# Patient Record
Sex: Male | Born: 1970 | Race: White | Hispanic: No | Marital: Single | Smoking: Never smoker
Health system: Southern US, Community
[De-identification: ages and names within clinical notes are randomized; demographics above are authoritative.]

## PROBLEM LIST (undated history)

## (undated) DIAGNOSIS — Z9889 Other specified postprocedural states: Secondary | ICD-10-CM

## (undated) DIAGNOSIS — R200 Anesthesia of skin: Secondary | ICD-10-CM

## (undated) DIAGNOSIS — M549 Dorsalgia, unspecified: Secondary | ICD-10-CM

## (undated) DIAGNOSIS — R112 Nausea with vomiting, unspecified: Secondary | ICD-10-CM

## (undated) DIAGNOSIS — J302 Other seasonal allergic rhinitis: Secondary | ICD-10-CM

## (undated) DIAGNOSIS — M1990 Unspecified osteoarthritis, unspecified site: Secondary | ICD-10-CM

## (undated) DIAGNOSIS — Z87442 Personal history of urinary calculi: Secondary | ICD-10-CM

## (undated) DIAGNOSIS — M62830 Muscle spasm of back: Secondary | ICD-10-CM

## (undated) DIAGNOSIS — G8929 Other chronic pain: Secondary | ICD-10-CM

## (undated) HISTORY — PX: FOOT SURGERY: SHX648

## (undated) HISTORY — PX: TONSILLECTOMY: SUR1361

---

## 2010-08-28 HISTORY — PX: OTHER SURGICAL HISTORY: SHX169

## 2011-08-29 HISTORY — PX: ANTERIOR CRUCIATE LIGAMENT REPAIR: SHX115

## 2013-08-08 HISTORY — PX: BACK SURGERY: SHX140

## 2014-01-01 ENCOUNTER — Other Ambulatory Visit: Payer: Self-pay | Admitting: Neurosurgery

## 2014-01-01 DIAGNOSIS — M5126 Other intervertebral disc displacement, lumbar region: Secondary | ICD-10-CM

## 2014-01-07 ENCOUNTER — Ambulatory Visit
Admission: RE | Admit: 2014-01-07 | Discharge: 2014-01-07 | Disposition: A | Discharge status: Home / Self Care | Payer: 59 | Source: Ambulatory Visit | Attending: Neurosurgery | Admitting: Neurosurgery

## 2014-01-07 VITALS — BP 104/67 | HR 61

## 2014-01-07 DIAGNOSIS — M5126 Other intervertebral disc displacement, lumbar region: Secondary | ICD-10-CM

## 2014-01-07 MED ORDER — MEPERIDINE HCL 100 MG/ML IJ SOLN
75.0000 mg | Freq: Once | INTRAMUSCULAR | Status: AC
  Administered 2014-01-07: 75 mg via INTRAMUSCULAR

## 2014-01-07 MED ORDER — ONDANSETRON HCL 4 MG/2ML IJ SOLN
4.0000 mg | Freq: Once | INTRAMUSCULAR | Status: AC
  Administered 2014-01-07: 4 mg via INTRAMUSCULAR

## 2014-01-07 MED ORDER — DIAZEPAM 5 MG PO TABS
10.0000 mg | ORAL_TABLET | Freq: Once | ORAL | Status: AC
  Administered 2014-01-07: 10 mg via ORAL

## 2014-01-07 MED ORDER — IOHEXOL 180 MG/ML  SOLN
15.0000 mL | Freq: Once | INTRAMUSCULAR | Status: AC | PRN
  Administered 2014-01-07: 15 mL via INTRATHECAL

## 2014-01-07 NOTE — Progress Notes (Signed)
Dr. Carlota RaspberryLamke in to visit and explained procedure to pt.

## 2014-01-07 NOTE — Discharge Instructions (Signed)

## 2014-01-07 NOTE — Progress Notes (Signed)
Late entry, BP from pre-procedure recorded at 10 03 am.

## 2014-01-12 ENCOUNTER — Other Ambulatory Visit: Payer: Self-pay | Admitting: Neurosurgery

## 2014-01-12 ENCOUNTER — Encounter (HOSPITAL_COMMUNITY): Payer: Self-pay | Admitting: Pharmacy Technician

## 2014-01-14 ENCOUNTER — Encounter (HOSPITAL_COMMUNITY)
Admission: RE | Admit: 2014-01-14 | Discharge: 2014-01-14 | Disposition: A | Discharge status: Home / Self Care | Payer: 59 | Source: Ambulatory Visit | Attending: Neurosurgery | Admitting: Neurosurgery

## 2014-01-14 ENCOUNTER — Encounter (HOSPITAL_COMMUNITY): Payer: Self-pay

## 2014-01-14 ENCOUNTER — Other Ambulatory Visit (HOSPITAL_COMMUNITY): Payer: Self-pay | Admitting: *Deleted

## 2014-01-14 DIAGNOSIS — Z01812 Encounter for preprocedural laboratory examination: Secondary | ICD-10-CM | POA: Insufficient documentation

## 2014-01-14 HISTORY — DX: Other specified postprocedural states: Z98.890

## 2014-01-14 HISTORY — DX: Unspecified osteoarthritis, unspecified site: M19.90

## 2014-01-14 HISTORY — DX: Nausea with vomiting, unspecified: R11.2

## 2014-01-14 LAB — CBC
HCT: 43.1 % (ref 39.0–52.0)
Hemoglobin: 15 g/dL (ref 13.0–17.0)
MCH: 30.1 pg (ref 26.0–34.0)
MCHC: 34.8 g/dL (ref 30.0–36.0)
MCV: 86.4 fL (ref 78.0–100.0)
Platelets: 198 10*3/uL (ref 150–400)
RBC: 4.99 MIL/uL (ref 4.22–5.81)
RDW: 12.3 % (ref 11.5–15.5)
WBC: 5.2 10*3/uL (ref 4.0–10.5)

## 2014-01-14 LAB — SURGICAL PCR SCREEN
MRSA, PCR: NEGATIVE
STAPHYLOCOCCUS AUREUS: POSITIVE — AB

## 2014-01-14 LAB — BASIC METABOLIC PANEL
BUN: 13 mg/dL (ref 6–23)
CHLORIDE: 105 meq/L (ref 96–112)
CO2: 26 mEq/L (ref 19–32)
Calcium: 9.5 mg/dL (ref 8.4–10.5)
Creatinine, Ser: 0.95 mg/dL (ref 0.50–1.35)
Glucose, Bld: 96 mg/dL (ref 70–99)
POTASSIUM: 4.2 meq/L (ref 3.7–5.3)
SODIUM: 143 meq/L (ref 137–147)

## 2014-01-14 NOTE — Progress Notes (Signed)
Rx for Mupirocin ointment called into [facility] in [place], [place] for positive PCR of staph. Pt notified and voiced understanding.

## 2014-01-14 NOTE — Progress Notes (Signed)
Pt's PCP is Dr. Fabienne BrunsFutrell in [place], [place].  Pt denies any type of cardiac history.

## 2014-01-14 NOTE — Pre-Procedure Instructions (Signed)
Christian Norman  01/14/2014   Your procedure is scheduled on:  Tuesday, Jan 20, 2014 at 7:30 AM.   [facility]" Admitting Office at 5:30 AM.   Call this number if you have problems the morning of surgery: [phone]   Remember:   Do not eat food or drink liquids after midnight Monday, 01/19/14.   Take these medicines the morning of surgery with A SIP OF WATER: None  Do not take any NSAIDS (Motrin, Ibuprofen, Aleve, etc) or Aspirin products prior to surgery.   Do not wear jewelry.  Do not wear lotions, powders, or cologne. You may wear deodorant.  Men may shave face and neck.  Do not bring valuables to the hospital.  [facility] not responsible                  for any belongings or valuables.               Contacts, dentures or bridgework may not be worn into surgery.  Leave suitcase in the car. After surgery it may be brought to your room.  For patients admitted to the hospital, discharge time is determined by your                treatment team.             Special Instructions: [place] - Preparing for Surgery  Before surgery, you can play an important role.  Because skin is not sterile, your skin needs to be as free of germs as possible.  You can reduce the number of germs on you skin by washing with CHG (chlorahexidine gluconate) soap before surgery.  CHG is an antiseptic cleaner which kills germs and bonds with the skin to continue killing germs even after washing.  Please DO NOT use if you have an allergy to CHG or antibacterial soaps.  If your skin becomes reddened/irritated stop using the CHG and inform your nurse when you arrive at Short Stay.  Do not shave (including legs and underarms) for at least 48 hours prior to the first CHG shower.  You may shave your face.  Please follow these instructions carefully:   1.  Shower with CHG Soap the night before surgery and the                                morning of Surgery.  2.  If you  choose to wash your hair, wash your hair first as usual with your       normal shampoo.  3.  After you shampoo, rinse your hair and body thoroughly to remove the                      Shampoo.  4.  Use CHG as you would any other liquid soap.  You can apply chg directly       to the skin and wash gently with scrungie or a clean washcloth.  5.  Apply the CHG Soap to your body ONLY FROM THE NECK DOWN.        Do not use on open wounds or open sores.  Avoid contact with your eyes, ears, mouth and genitals (private parts).  Wash genitals (private parts) with your normal soap.  6.  Wash thoroughly, paying special attention to the area where your surgery        will be  performed.  7.  Thoroughly rinse your body with warm water from the neck down.  8.  DO NOT shower/wash with your normal soap after using and rinsing off       the CHG Soap.  9.  Pat yourself dry with a clean towel.            10.  Wear clean pajamas.            11.  Place clean sheets on your bed the night of your first shower and do not        sleep with pets.  Day of Surgery  Do not apply any lotions the morning of surgery.  Please wear clean clothes to the hospital/surgery center.     Please read over the following fact sheets that you were given: Pain Booklet, Coughing and Deep Breathing, MRSA Information and Surgical Site Infection Prevention

## 2014-01-19 MED ORDER — DEXAMETHASONE SODIUM PHOSPHATE 10 MG/ML IJ SOLN
10.0000 mg | INTRAMUSCULAR | Status: AC
  Administered 2014-01-20: 10 mg via INTRAVENOUS
  Filled 2014-01-19: qty 1

## 2014-01-19 MED ORDER — CEFAZOLIN SODIUM-DEXTROSE 2-3 GM-% IV SOLR
2.0000 g | INTRAVENOUS | Status: AC
  Administered 2014-01-20: 2 g via INTRAVENOUS

## 2014-01-20 ENCOUNTER — Ambulatory Visit (HOSPITAL_COMMUNITY): Payer: 59 | Admitting: Anesthesiology

## 2014-01-20 ENCOUNTER — Encounter (HOSPITAL_COMMUNITY): Payer: Self-pay | Admitting: Anesthesiology

## 2014-01-20 ENCOUNTER — Encounter (HOSPITAL_COMMUNITY)
Admission: RE | Disposition: A | Discharge status: Home / Self Care | Payer: Self-pay | Source: Ambulatory Visit | Attending: Neurosurgery

## 2014-01-20 ENCOUNTER — Inpatient Hospital Stay (HOSPITAL_COMMUNITY)
Admission: RE | Admit: 2014-01-20 | Discharge: 2014-01-24 | DRG: 030 | Disposition: A | Discharge status: Home / Self Care | Payer: 59 | Source: Ambulatory Visit | Attending: Neurosurgery | Admitting: Neurosurgery

## 2014-01-20 ENCOUNTER — Encounter (HOSPITAL_COMMUNITY): Payer: 59 | Admitting: Anesthesiology

## 2014-01-20 DIAGNOSIS — Y838 Other surgical procedures as the cause of abnormal reaction of the patient, or of later complication, without mention of misadventure at the time of the procedure: Secondary | ICD-10-CM | POA: Diagnosis present

## 2014-01-20 DIAGNOSIS — G969 Disorder of central nervous system, unspecified: Principal | ICD-10-CM | POA: Diagnosis present

## 2014-01-20 DIAGNOSIS — G9619 Other disorders of meninges, not elsewhere classified: Secondary | ICD-10-CM

## 2014-01-20 DIAGNOSIS — G96198 Other disorders of meninges, not elsewhere classified: Secondary | ICD-10-CM | POA: Diagnosis present

## 2014-01-20 HISTORY — PX: LUMBAR LAMINECTOMY/DECOMPRESSION MICRODISCECTOMY: SHX5026

## 2014-01-20 SURGERY — LUMBAR LAMINECTOMY/DECOMPRESSION MICRODISCECTOMY 1 LEVEL
Anesthesia: General | Site: Back

## 2014-01-20 MED ORDER — HEMOSTATIC AGENTS (NO CHARGE) OPTIME
TOPICAL | Status: DC | PRN
  Administered 2014-01-20: 1 via TOPICAL

## 2014-01-20 MED ORDER — SODIUM CHLORIDE 0.9 % IV SOLN: 250.0000 mL | INTRAVENOUS | Status: DC

## 2014-01-20 MED ORDER — FENTANYL CITRATE 0.05 MG/ML IJ SOLN
INTRAMUSCULAR | Status: AC
  Filled 2014-01-20: qty 5

## 2014-01-20 MED ORDER — GLYCOPYRROLATE 0.2 MG/ML IJ SOLN
INTRAMUSCULAR | Status: DC | PRN
  Administered 2014-01-20: 0.4 mg via INTRAVENOUS

## 2014-01-20 MED ORDER — OXYCODONE HCL 5 MG/5ML PO SOLN: 5.0000 mg | Freq: Once | ORAL | Status: DC | PRN

## 2014-01-20 MED ORDER — ONDANSETRON HCL 4 MG/2ML IJ SOLN: 4.0000 mg | INTRAMUSCULAR | Status: DC | PRN

## 2014-01-20 MED ORDER — FENTANYL CITRATE 0.05 MG/ML IJ SOLN
INTRAMUSCULAR | Status: DC | PRN
Start: 2014-01-20 — End: 2014-01-20
  Administered 2014-01-20: 100 ug via INTRAVENOUS

## 2014-01-20 MED ORDER — OXYCODONE HCL 5 MG PO TABS: 5.0000 mg | ORAL_TABLET | Freq: Once | ORAL | Status: DC | PRN

## 2014-01-20 MED ORDER — ONDANSETRON HCL 4 MG/2ML IJ SOLN
INTRAMUSCULAR | Status: DC | PRN
  Administered 2014-01-20: 4 mg via INTRAVENOUS

## 2014-01-20 MED ORDER — BUPIVACAINE HCL (PF) 0.5 % IJ SOLN
INTRAMUSCULAR | Status: DC | PRN
  Administered 2014-01-20: 20 mL

## 2014-01-20 MED ORDER — HYDROMORPHONE HCL PF 1 MG/ML IJ SOLN
0.2500 mg | INTRAMUSCULAR | Status: DC | PRN
  Administered 2014-01-20 (×2): 0.5 mg via INTRAVENOUS

## 2014-01-20 MED ORDER — SUCCINYLCHOLINE CHLORIDE 20 MG/ML IJ SOLN
INTRAMUSCULAR | Status: AC
  Filled 2014-01-20: qty 1

## 2014-01-20 MED ORDER — PANTOPRAZOLE SODIUM 40 MG IV SOLR
40.0000 mg | Freq: Every day | INTRAVENOUS | Status: DC
  Administered 2014-01-20: 40 mg via INTRAVENOUS
  Filled 2014-01-20 (×2): qty 40

## 2014-01-20 MED ORDER — KCL IN DEXTROSE-NACL 20-5-0.45 MEQ/L-%-% IV SOLN
80.0000 mL/h | INTRAVENOUS | Status: DC
  Administered 2014-01-20: 80 mL/h via INTRAVENOUS
  Filled 2014-01-20 (×9): qty 1000

## 2014-01-20 MED ORDER — MIDAZOLAM HCL 2 MG/2ML IJ SOLN
INTRAMUSCULAR | Status: AC
  Filled 2014-01-20: qty 2

## 2014-01-20 MED ORDER — LIDOCAINE HCL (CARDIAC) 20 MG/ML IV SOLN
INTRAVENOUS | Status: DC | PRN
  Administered 2014-01-20: 70 mg via INTRAVENOUS

## 2014-01-20 MED ORDER — HYDROMORPHONE HCL PF 1 MG/ML IJ SOLN
INTRAMUSCULAR | Status: AC
  Filled 2014-01-20: qty 1

## 2014-01-20 MED ORDER — ROCURONIUM BROMIDE 100 MG/10ML IV SOLN
INTRAVENOUS | Status: DC | PRN
  Administered 2014-01-20: 50 mg via INTRAVENOUS

## 2014-01-20 MED ORDER — BACITRACIN ZINC 500 UNIT/GM EX OINT
TOPICAL_OINTMENT | CUTANEOUS | Status: DC | PRN
  Administered 2014-01-20: 1 via TOPICAL

## 2014-01-20 MED ORDER — MIDAZOLAM HCL 2 MG/2ML IJ SOLN: 0.5000 mg | Freq: Once | INTRAMUSCULAR | Status: DC | PRN

## 2014-01-20 MED ORDER — 0.9 % SODIUM CHLORIDE (POUR BTL) OPTIME
TOPICAL | Status: DC | PRN
  Administered 2014-01-20: 1000 mL

## 2014-01-20 MED ORDER — EPHEDRINE SULFATE 50 MG/ML IJ SOLN
INTRAMUSCULAR | Status: AC
  Filled 2014-01-20: qty 1

## 2014-01-20 MED ORDER — SODIUM CHLORIDE 0.9 % IJ SOLN
3.0000 mL | Freq: Two times a day (BID) | INTRAMUSCULAR | Status: DC
  Administered 2014-01-20 – 2014-01-23 (×5): 3 mL via INTRAVENOUS

## 2014-01-20 MED ORDER — SODIUM CHLORIDE 0.9 % IJ SOLN
3.0000 mL | INTRAMUSCULAR | Status: DC | PRN
  Administered 2014-01-23: 3 mL via INTRAVENOUS

## 2014-01-20 MED ORDER — ARTIFICIAL TEARS OP OINT
TOPICAL_OINTMENT | OPHTHALMIC | Status: AC
  Filled 2014-01-20: qty 3.5

## 2014-01-20 MED ORDER — ACETAMINOPHEN 650 MG RE SUPP: 650.0000 mg | RECTAL | Status: DC | PRN

## 2014-01-20 MED ORDER — PROMETHAZINE HCL 25 MG/ML IJ SOLN
INTRAMUSCULAR | Status: AC
  Filled 2014-01-20: qty 1

## 2014-01-20 MED ORDER — ROCURONIUM BROMIDE 50 MG/5ML IV SOLN
INTRAVENOUS | Status: AC
  Filled 2014-01-20: qty 1

## 2014-01-20 MED ORDER — ARTIFICIAL TEARS OP OINT
TOPICAL_OINTMENT | OPHTHALMIC | Status: DC | PRN
  Administered 2014-01-20: 1 via OPHTHALMIC

## 2014-01-20 MED ORDER — LACTATED RINGERS IV SOLN
INTRAVENOUS | Status: DC | PRN
  Administered 2014-01-20 (×2): via INTRAVENOUS

## 2014-01-20 MED ORDER — ACETAMINOPHEN 325 MG PO TABS: 650.0000 mg | ORAL_TABLET | ORAL | Status: DC | PRN

## 2014-01-20 MED ORDER — LIDOCAINE HCL (CARDIAC) 20 MG/ML IV SOLN
INTRAVENOUS | Status: AC
  Filled 2014-01-20: qty 5

## 2014-01-20 MED ORDER — SODIUM CHLORIDE 0.9 % IJ SOLN
INTRAMUSCULAR | Status: AC
  Filled 2014-01-20: qty 10

## 2014-01-20 MED ORDER — PHENOL 1.4 % MT LIQD: 1.0000 | OROMUCOSAL | Status: DC | PRN

## 2014-01-20 MED ORDER — ONDANSETRON HCL 4 MG/2ML IJ SOLN
INTRAMUSCULAR | Status: AC
  Filled 2014-01-20: qty 2

## 2014-01-20 MED ORDER — SODIUM CHLORIDE 0.9 % IR SOLN
Status: DC | PRN
  Administered 2014-01-20: 08:00:00

## 2014-01-20 MED ORDER — OXYCODONE-ACETAMINOPHEN 5-325 MG PO TABS
1.0000 | ORAL_TABLET | ORAL | Status: DC | PRN
  Administered 2014-01-22 – 2014-01-23 (×3): 2 via ORAL
  Administered 2014-01-23: 1 via ORAL
  Administered 2014-01-24: 2 via ORAL
  Filled 2014-01-20: qty 1
  Filled 2014-01-20 (×4): qty 2

## 2014-01-20 MED ORDER — MIDAZOLAM HCL 5 MG/5ML IJ SOLN
INTRAMUSCULAR | Status: DC | PRN
  Administered 2014-01-20: 2 mg via INTRAVENOUS

## 2014-01-20 MED ORDER — PROMETHAZINE HCL 25 MG/ML IJ SOLN
6.2500 mg | INTRAMUSCULAR | Status: DC | PRN
Start: 2014-01-20 — End: 2014-01-20
  Administered 2014-01-20: 6.25 mg via INTRAVENOUS

## 2014-01-20 MED ORDER — PHENYLEPHRINE 40 MCG/ML (10ML) SYRINGE FOR IV PUSH (FOR BLOOD PRESSURE SUPPORT)
PREFILLED_SYRINGE | INTRAVENOUS | Status: AC
  Filled 2014-01-20: qty 10

## 2014-01-20 MED ORDER — NEOSTIGMINE METHYLSULFATE 10 MG/10ML IV SOLN
INTRAVENOUS | Status: AC
  Filled 2014-01-20: qty 1

## 2014-01-20 MED ORDER — THROMBIN 5000 UNITS EX SOLR
CUTANEOUS | Status: DC | PRN
  Administered 2014-01-20 (×2): 5000 [IU] via TOPICAL

## 2014-01-20 MED ORDER — GLYCOPYRROLATE 0.2 MG/ML IJ SOLN
INTRAMUSCULAR | Status: AC
  Filled 2014-01-20: qty 1

## 2014-01-20 MED ORDER — PROPOFOL 10 MG/ML IV BOLUS
INTRAVENOUS | Status: AC
  Filled 2014-01-20: qty 20

## 2014-01-20 MED ORDER — SCOPOLAMINE 1 MG/3DAYS TD PT72
MEDICATED_PATCH | TRANSDERMAL | Status: AC
  Administered 2014-01-20: 1 via TRANSDERMAL
  Filled 2014-01-20: qty 1

## 2014-01-20 MED ORDER — PROPOFOL 10 MG/ML IV BOLUS
INTRAVENOUS | Status: DC | PRN
  Administered 2014-01-20: 180 mg via INTRAVENOUS

## 2014-01-20 MED ORDER — GLYCOPYRROLATE 0.2 MG/ML IJ SOLN
INTRAMUSCULAR | Status: AC
  Filled 2014-01-20: qty 2

## 2014-01-20 MED ORDER — MEPERIDINE HCL 25 MG/ML IJ SOLN: 6.2500 mg | INTRAMUSCULAR | Status: DC | PRN

## 2014-01-20 MED ORDER — SCOPOLAMINE 1 MG/3DAYS TD PT72: 1.0000 | MEDICATED_PATCH | TRANSDERMAL | Status: DC

## 2014-01-20 MED ORDER — CEFAZOLIN SODIUM-DEXTROSE 2-3 GM-% IV SOLR
2.0000 g | Freq: Three times a day (TID) | INTRAVENOUS | Status: AC
  Administered 2014-01-20 – 2014-01-21 (×2): 2 g via INTRAVENOUS
  Filled 2014-01-20 (×2): qty 50

## 2014-01-20 MED ORDER — HYDROMORPHONE HCL PF 1 MG/ML IJ SOLN
1.0000 mg | INTRAMUSCULAR | Status: DC | PRN
  Administered 2014-01-20 – 2014-01-22 (×6): 1 mg via INTRAMUSCULAR
  Filled 2014-01-20 (×6): qty 1

## 2014-01-20 MED ORDER — HEMOSTATIC AGENTS (NO CHARGE) OPTIME
TOPICAL | Status: DC | PRN
  Administered 2014-01-20 (×2): 1 via TOPICAL

## 2014-01-20 MED ORDER — CYCLOBENZAPRINE HCL 10 MG PO TABS
10.0000 mg | ORAL_TABLET | Freq: Three times a day (TID) | ORAL | Status: DC | PRN
  Administered 2014-01-22 – 2014-01-23 (×2): 10 mg via ORAL
  Filled 2014-01-20 (×2): qty 1

## 2014-01-20 MED ORDER — MENTHOL 3 MG MT LOZG: 1.0000 | LOZENGE | OROMUCOSAL | Status: DC | PRN

## 2014-01-20 MED ORDER — NEOSTIGMINE METHYLSULFATE 10 MG/10ML IV SOLN
INTRAVENOUS | Status: DC | PRN
  Administered 2014-01-20: 3 mg via INTRAVENOUS

## 2014-01-20 SURGICAL SUPPLY — 55 items
BAG DECANTER FOR FLEXI CONT (MISCELLANEOUS) ×3 IMPLANT
BENZOIN TINCTURE PRP APPL 2/3 (GAUZE/BANDAGES/DRESSINGS) ×6 IMPLANT
BLADE 10 SAFETY STRL DISP (BLADE) IMPLANT
BLADE SURG ROTATE 9660 (MISCELLANEOUS) ×3 IMPLANT
BRUSH SCRUB EZ PLAIN DRY (MISCELLANEOUS) ×3 IMPLANT
BUR CUTTER 7.0 ROUND (BURR) ×3 IMPLANT
BUR MATCHSTICK NEURO 3.0 LAGG (BURR) ×3 IMPLANT
CANISTER SUCT 3000ML (MISCELLANEOUS) ×3 IMPLANT
CLOSURE WOUND 1/2 X4 (GAUZE/BANDAGES/DRESSINGS) ×1
CONT SPEC 4OZ CLIKSEAL STRL BL (MISCELLANEOUS) ×3 IMPLANT
DERMABOND ADHESIVE PROPEN (GAUZE/BANDAGES/DRESSINGS)
DERMABOND ADVANCED (GAUZE/BANDAGES/DRESSINGS)
DERMABOND ADVANCED .7 DNX12 (GAUZE/BANDAGES/DRESSINGS) IMPLANT
DERMABOND ADVANCED .7 DNX6 (GAUZE/BANDAGES/DRESSINGS) IMPLANT
DRAPE LAPAROTOMY 100X72X124 (DRAPES) ×3 IMPLANT
DRAPE MICROSCOPE LEICA (MISCELLANEOUS) ×3 IMPLANT
DRAPE MICROSCOPE ZEISS OPMI (DRAPES) IMPLANT
DRAPE SURG 17X23 STRL (DRAPES) ×6 IMPLANT
DRESSING TELFA 8X3 (GAUZE/BANDAGES/DRESSINGS) IMPLANT
DRSG OPSITE POSTOP 4X6 (GAUZE/BANDAGES/DRESSINGS) ×3 IMPLANT
DURAPREP 26ML APPLICATOR (WOUND CARE) ×3 IMPLANT
DURASEAL APPLICATOR TIP (TIP) ×3 IMPLANT
DURASEAL SPINE SEALANT 3ML (MISCELLANEOUS) ×3 IMPLANT
ELECT REM PT RETURN 9FT ADLT (ELECTROSURGICAL) ×3
ELECTRODE REM PT RTRN 9FT ADLT (ELECTROSURGICAL) ×1 IMPLANT
GAUZE SPONGE 4X4 16PLY XRAY LF (GAUZE/BANDAGES/DRESSINGS) IMPLANT
GLOVE BIOGEL PI IND STRL 7.5 (GLOVE) ×1 IMPLANT
GLOVE BIOGEL PI INDICATOR 7.5 (GLOVE) ×2
GLOVE ECLIPSE 7.0 STRL STRAW (GLOVE) ×9 IMPLANT
GLOVE ECLIPSE 8.0 STRL XLNG CF (GLOVE) ×6 IMPLANT
GOWN STRL REUS W/ TWL LRG LVL3 (GOWN DISPOSABLE) IMPLANT
GOWN STRL REUS W/ TWL XL LVL3 (GOWN DISPOSABLE) ×2 IMPLANT
GOWN STRL REUS W/TWL 2XL LVL3 (GOWN DISPOSABLE) IMPLANT
GOWN STRL REUS W/TWL LRG LVL3 (GOWN DISPOSABLE)
GOWN STRL REUS W/TWL XL LVL3 (GOWN DISPOSABLE) ×4
HEMOSTAT SURGICEL 2X14 (HEMOSTASIS) ×3 IMPLANT
KIT BASIN OR (CUSTOM PROCEDURE TRAY) ×3 IMPLANT
KIT ROOM TURNOVER OR (KITS) ×3 IMPLANT
NEEDLE HYPO 22GX1.5 SAFETY (NEEDLE) ×3 IMPLANT
NEEDLE SPNL 22GX3.5 QUINCKE BK (NEEDLE) ×3 IMPLANT
NS IRRIG 1000ML POUR BTL (IV SOLUTION) ×3 IMPLANT
PACK LAMINECTOMY NEURO (CUSTOM PROCEDURE TRAY) ×3 IMPLANT
PAD ARMBOARD 7.5X6 YLW CONV (MISCELLANEOUS) ×9 IMPLANT
PATTIES SURGICAL .75X.75 (GAUZE/BANDAGES/DRESSINGS) IMPLANT
RUBBERBAND STERILE (MISCELLANEOUS) ×6 IMPLANT
SPONGE GAUZE 4X4 12PLY (GAUZE/BANDAGES/DRESSINGS) ×3 IMPLANT
SPONGE SURGIFOAM ABS GEL SZ50 (HEMOSTASIS) ×3 IMPLANT
STRIP CLOSURE SKIN 1/2X4 (GAUZE/BANDAGES/DRESSINGS) ×2 IMPLANT
SUT PROLENE 0 CT 1 30 (SUTURE) ×3 IMPLANT
SUT VIC AB 2-0 OS6 18 (SUTURE) ×12 IMPLANT
SUT VIC AB 3-0 CP2 18 (SUTURE) ×3 IMPLANT
SYR 20ML ECCENTRIC (SYRINGE) ×3 IMPLANT
TOWEL OR 17X24 6PK STRL BLUE (TOWEL DISPOSABLE) ×3 IMPLANT
TOWEL OR 17X26 10 PK STRL BLUE (TOWEL DISPOSABLE) ×3 IMPLANT
WATER STERILE IRR 1000ML POUR (IV SOLUTION) ×3 IMPLANT

## 2014-01-20 NOTE — Anesthesia Procedure Notes (Signed)
Procedure Name: Intubation Date/Time: 01/20/2014 7:37 AM Performed by: Lovie Chol Pre-anesthesia Checklist: Patient identified, Emergency Drugs available, Suction available, Patient being monitored and Timeout performed Patient Re-evaluated:Patient Re-evaluated prior to inductionOxygen Delivery Method: Circle system utilized Preoxygenation: Pre-oxygenation with 100% oxygen Intubation Type: IV induction Ventilation: Mask ventilation without difficulty Laryngoscope Size: Miller and 2 Grade View: Grade I Tube type: Oral Tube size: 7.5 mm Number of attempts: 1 Airway Equipment and Method: Stylet Placement Confirmation: ETT inserted through vocal cords under direct vision,  positive ETCO2,  CO2 detector and breath sounds checked- equal and bilateral Secured at: 22 cm Tube secured with: Tape Dental Injury: Teeth and Oropharynx as per pre-operative assessment

## 2014-01-20 NOTE — Anesthesia Preprocedure Evaluation (Addendum)
Anesthesia Evaluation  Patient identified by MRN, date of birth, ID band Patient awake    Reviewed: Allergy & Precautions, H&P , NPO status , Patient's Chart, lab work & pertinent test results  History of Anesthesia Complications (+) PONV and history of anesthetic complications  Airway Mallampati: II TM Distance: >3 FB Neck ROM: Full    Dental  (+) Teeth Intact, Dental Advisory Given   Pulmonary neg pulmonary ROS,  breath sounds clear to auscultation  Pulmonary exam normal       Cardiovascular - anginanegative cardio ROS  Rhythm:Regular Rate:Normal     Neuro/Psych R leg numbness with pseudomeningocele    GI/Hepatic negative GI ROS, Neg liver ROS,   Endo/Other  negative endocrine ROS  Renal/GU Renal diseasenegative Renal ROS     Musculoskeletal   Abdominal   Peds  Hematology negative hematology ROS (+)   Anesthesia Other Findings   Reproductive/Obstetrics                          Anesthesia Physical Anesthesia Plan  ASA: I  Anesthesia Plan: General   Post-op Pain Management:    Induction: Intravenous  Airway Management Planned: Oral ETT  Additional Equipment:   Intra-op Plan:   Post-operative Plan: Extubation in OR  Informed Consent: I have reviewed the patients History and Physical, chart, labs and discussed the procedure including the risks, benefits and alternatives for the proposed anesthesia with the patient or authorized representative who has indicated his/her understanding and acceptance.   Dental advisory given  Plan Discussed with: CRNA, Surgeon and Anesthesiologist  Anesthesia Plan Comments: (Plan routine monitors, GETA)       Anesthesia Quick Evaluation

## 2014-01-20 NOTE — Op Note (Signed)
Preop diagnosis: Pseudomeningocele L5-S1 Postop diagnosis: Same Procedure: Right L5-S1 intralaminar laminotomy for repair of pseudomeningocele Surgeon: Chianna Spirito Assistant: Elsner  After being placed the prone position the patient's back was prepped and draped in usual sterile fashion. Previous lumbar incision was opened up carried down through the soft tissue. Clear fluid was immediately encountered. We are easily able to dissect the tissue off of the lamina and identified the previous laminotomy and L5-S1 on the right. Self-retaining tract was placed for exposure. In the middle of the field an obvious large meningocele was identified. We slightly enlarged the laminotomy. Were able to get sulcal edges around the meningocele. We replaced the arachnoid back within the dura and placed 3 vertical mattress Prolene stitches which had excellent closure of the dural opening. We did a Valsalva to 35 and saw no fluid coming out the dura. We irrigated copiously controlled any bleeders proper coagulation and then placed a small patch of Surgicel and muscle over the dural repair. We then placed a dural sealant over this. We then closed the wound in multiple layers of Vicryl on the muscle fascia subcutaneous and subcuticular tissues. We did a running locking Prolene on the skin. A sterile dressing was then applied and the patient was extubated and taken to recovery room in stable condition.

## 2014-01-20 NOTE — Anesthesia Postprocedure Evaluation (Signed)
  Anesthesia Post-op Note  Patient: Christian Norman  Procedure(s) Performed: Procedure(s): Lumbar five-Sacral one laminectomy for pseudomeningocele (N/A)  Patient Location: PACU  Anesthesia Type:General  Level of Consciousness: awake  Airway and Oxygen Therapy: Patient Spontanous Breathing  Post-op Pain: mild  Post-op Assessment: Post-op Vital signs reviewed  Post-op Vital Signs: Reviewed  Last Vitals:  Filed Vitals:   01/20/14 0945  BP: 125/70  Pulse: 91  Temp:   Resp: 15    Complications: No apparent anesthesia complications

## 2014-01-20 NOTE — H&P (Signed)
  Christian Norman is an 43 y.o. male.   Chief Complaint: Back and leg pain HPI: The patient is a 43 year old gentleman who had a lumbar discectomy in December of 2014. There was a dural tear at surgery but healed without difficulty. He was doing fairly well until February when he had the sudden onset of increased pain and he then developed some swelling under a well-healed incision. He was tried on conservative therapy without improvement and eventually underwent repeat imaging studies with myelography. This confirmed the presence of a pseudomeningocele at the site of his previous surgery with tracking up to the skin. After discussing the options it was elected to reexplore the area to see if there was some explanation for his pain and also to perhaps reclosed the dural defect if indicated and resolved a pseudomeningocele. I've had a long discussion with him regarding the risks and benefits of surgical intervention. The risks discussed include but are not limited to bleeding infection weakness numbness persistent pain persistent spinal fluid leakage coma and death. We have discussed alternative methods of therapy offered risks and benefits of nonintervention. He's had the opportunity to ask numerous questions and appears to understand. With this information in hand he has requested we proceed with surgery.  Past Medical History  Diagnosis Date  . Kidney stones   . Arthritis   . PONV (postoperative nausea and vomiting)     Past Surgical History  Procedure Laterality Date  . Back surgery  08/08/13    lumbar laminectomy  . Tonsillectomy    . Appendectomy    . Cholecystectomy    . Foot surgery Bilateral     surgery x 3 for club feet  . Anterior cruciate ligament repair Left 2013    Family History  Problem Relation Age of Onset  . Leukemia Mother    Social History:  reports that he has never smoked. He has never used smokeless tobacco. He reports that he does not drink alcohol or use illicit  drugs.  Allergies:  Allergies  Allergen Reactions  . Vicodin [Hydrocodone-Acetaminophen] Nausea And Vomiting    No prescriptions prior to admission    No results found for this or any previous visit (from the past 48 hour(s)). No results found.  Unremarkable  Pulse 70, temperature 97.6 F (36.4 C), temperature source Oral, resp. rate 16, SpO2 97.00%.  The patient is awake or and oriented. His no facial asymmetry. His gait is slow mildly antalgic. Reflexes are decreased but he is normal strength. He has a decreased sensation on the lateral aspect of the right foot his incision is well-healed but does have some swelling of the superior aspect but feels like fluid. Assessment/Plan Impression is that of a pseudomeningocele and L5-S1. The plan is for exploration for repair.  Christian Meeker, MD 01/20/2014, 7:27 AM

## 2014-01-20 NOTE — Progress Notes (Signed)
Pt 43 yrs old v oldpost back surgery a transfer from PACU, alert and oriented x 4 neuro intact . Oriented to unit and use of call light for assisted . Pt to be flat for 3 days and pt aware. Medicated for pain pain rate at 4/10 at present

## 2014-01-20 NOTE — Transfer of Care (Signed)
Immediate Anesthesia Transfer of Care Note  Patient: Christian Norman  Procedure(s) Performed: Procedure(s): Lumbar five-Sacral one laminectomy for pseudomeningocele (N/A)  Patient Location: PACU  Anesthesia Type:General  Level of Consciousness: awake, oriented and patient cooperative  Airway & Oxygen Therapy: Patient Spontanous Breathing and Patient connected to nasal cannula oxygen  Post-op Assessment: Report given to PACU RN and Post -op Vital signs reviewed and stable  Post vital signs: Reviewed  Complications: No apparent anesthesia complications

## 2014-01-21 ENCOUNTER — Encounter (HOSPITAL_COMMUNITY): Payer: Self-pay | Admitting: *Deleted

## 2014-01-21 MED ORDER — PANTOPRAZOLE SODIUM 40 MG PO TBEC
40.0000 mg | DELAYED_RELEASE_TABLET | Freq: Every day | ORAL | Status: DC
  Filled 2014-01-21: qty 1

## 2014-01-21 NOTE — Progress Notes (Signed)
UR complete.  Ragina Fenter RN, MSN 

## 2014-01-21 NOTE — Progress Notes (Signed)
Patient ID: Christian Norman, male   DOB: 1971/05/13, 43 y.o.   MRN: [id number] Afeb, vss No new neuro issues. Wound clean and dry. Will keep flat until; Friday.

## 2014-01-22 ENCOUNTER — Encounter (HOSPITAL_COMMUNITY): Payer: Self-pay | Admitting: Neurosurgery

## 2014-01-22 NOTE — Progress Notes (Signed)
Patient ID: Christian Norman, male   DOB: [birth year], 43 y.o.   MRN: [id number] Afeb, vss No new neuro issues Wound dressing clean and dry. Will mobilize tomorrow.

## 2014-01-23 NOTE — Progress Notes (Signed)
Pt ambulated to bathroom.  C/o numbness on leg that has been present prior to surgery.  Was able to tolerate ambulation without getting a headache or lightheaded.  Will continue to monitor patient.

## 2014-01-23 NOTE — Progress Notes (Signed)
Patient ID: Christian Norman, male   DOB: 21-Apr-1971, 43 y.o.   MRN: [id number] Afeb, vss No new neuro issues. Not yet out of bed. Will increase activity today, and hopefully go home tomorrow.

## 2014-01-24 MED ORDER — OXYCODONE-ACETAMINOPHEN 5-325 MG PO TABS: 1.0000 | ORAL_TABLET | ORAL | Status: DC | PRN

## 2014-01-24 MED ORDER — CYCLOBENZAPRINE HCL 10 MG PO TABS: 10.0000 mg | ORAL_TABLET | Freq: Three times a day (TID) | ORAL | Status: AC | PRN

## 2014-01-24 NOTE — Discharge Instructions (Signed)
No lifting no bending no twisting no driving a riding a car less discomfort for to see Dr. Gerlene Fee

## 2014-01-24 NOTE — Progress Notes (Signed)
Patient d/ced this morning, assessments unchanged as at now. Educated on the need to follow up with the MD on Wednesday and take medication appropriately as described.

## 2014-01-24 NOTE — Progress Notes (Signed)
Patient ID: Christian Norman, male   DOB: 04-07-1971, 44 y.o.   MRN: [id number] Minimal headache improved radicular symptoms  Incision clean dry and intact  Discharge home

## 2014-01-24 NOTE — Discharge Summary (Signed)
  Physician Discharge Summary  Patient ID: Christian Norman MRN: [id number] DOB/AGE: [birth year] 43 y.o.  Admit date: 01/20/2014 Discharge date: 01/24/2014  Admission Diagnoses: pseudomeningocele  Discharge Diagnoses: same Active Problems:   Pseudomeningocele   Discharged Condition: good  Hospital Course: patient admitted hospital underwent a reexploration of lumbar wound for repair of a pseudomeningocele postop patient did very well recovered in the floor on the floor he was ambulating and voiding and convalescing well was stable for discharge home  Consults: Significant Diagnostic Studies: Treatments:repair of pseudomeningocele Discharge Exam: Blood pressure 99/63, pulse 63, temperature 98.6 F (37 C), temperature source Oral, resp. rate 18, height 5\' 10"  (1.778 m), weight 79.379 kg (175 lb), SpO2 96.00%. Neurologically stable  Disposition: home     Medication List         cyclobenzaprine 10 MG tablet  Commonly known as:  FLEXERIL  Take 1 tablet (10 mg total) by mouth 3 (three) times daily as needed for muscle spasms.     oxyCODONE-acetaminophen 5-325 MG per tablet  Commonly known as:  PERCOCET/ROXICET  Take 1-2 tablets by mouth every 4 (four) hours as needed for moderate pain.           Follow-up Information   Follow up with Reinaldo Meeker, MD.   Specialty:  Neurosurgery   Contact information:   [address] [place] [address] [phone]       Signed: Mariam Dollar 01/24/2014, 9:45 AM

## 2014-06-17 ENCOUNTER — Other Ambulatory Visit: Payer: Self-pay | Admitting: Neurosurgery

## 2014-06-18 ENCOUNTER — Other Ambulatory Visit: Payer: Self-pay | Admitting: Neurosurgery

## 2014-08-06 NOTE — Pre-Procedure Instructions (Signed)
Dondra SpryBradford Mongeau  08/06/2014   Your procedure is scheduled on:  Tues, Dec 22 @ 7:30 AM  [facility]  at 5:30 AM.  Call this number if you have problems the morning of surgery: [phone]   Remember:   Do not eat food or drink liquids after midnight.   Take these medicines the morning of surgery with A SIP OF WATER: Pain Pill(if needed)               Stop taking your Ibuprofen a week before surgery. No Goody's,BC's,Aleve,Aspirin,Fish Oil,or any Herbal Medications   Do not wear jewelry  Do not wear lotions, powders, or colognes. You may wear deodorant.  Men may shave face and neck.  Do not bring valuables to the hospital.  [facility] is not responsible                  for any belongings or valuables.               Contacts, dentures or bridgework may not be worn into surgery.  Leave suitcase in the car. After surgery it may be brought to your room.  For patients admitted to the hospital, discharge time is determined by your                treatment team.               Patients discharged the day of surgery will not be allowed to drive  home.    Special Instructions:  [facility] - Preparing for Surgery  Before surgery, you can play an important role.  Because skin is not sterile, your skin needs to be as free of germs as possible.  You can reduce the number of germs on you skin by washing with CHG (chlorahexidine gluconate) soap before surgery.  CHG is an antiseptic cleaner which kills germs and bonds with the skin to continue killing germs even after washing.  Please DO NOT use if you have an allergy to CHG or antibacterial soaps.  If your skin becomes reddened/irritated stop using the CHG and inform your nurse when you arrive at Short Stay.  Do not shave (including legs and underarms) for at least 48 hours prior to the first CHG shower.  You may shave your face.  Please follow these instructions carefully:   1.  Shower with CHG Soap the night before surgery  and the                                morning of Surgery.  2.  If you choose to wash your hair, wash your hair first as usual with your       normal shampoo.  3.  After you shampoo, rinse your hair and body thoroughly to remove the                      Shampoo.  4.  Use CHG as you would any other liquid soap.  You can apply chg directly       to the skin and wash gently with scrungie or a clean washcloth.  5.  Apply the CHG Soap to your body ONLY FROM THE NECK DOWN.        Do not use on open wounds or open sores.  Avoid contact with your eyes,       ears, mouth and genitals (private  parts).  Wash genitals (private parts)       with your normal soap.  6.  Wash thoroughly, paying special attention to the area where your surgery        will be performed.  7.  Thoroughly rinse your body with warm water from the neck down.  8.  DO NOT shower/wash with your normal soap after using and rinsing off       the CHG Soap.  9.  Pat yourself dry with a clean towel.            10.  Wear clean pajamas.            11.  Place clean sheets on your bed the night of your first shower and do not        sleep with pets.  Day of Surgery  Do not apply any lotions/deoderants the morning of surgery.  Please wear clean clothes to the hospital/surgery center.     Please read over the following fact sheets that you were given: Pain Booklet, Coughing and Deep Breathing, MRSA Information and Surgical Site Infection Prevention

## 2014-08-07 ENCOUNTER — Encounter (HOSPITAL_COMMUNITY): Payer: Self-pay

## 2014-08-07 ENCOUNTER — Encounter (HOSPITAL_COMMUNITY)
Admission: RE | Admit: 2014-08-07 | Discharge: 2014-08-07 | Disposition: A | Discharge status: Home / Self Care | Payer: 59 | Source: Ambulatory Visit | Attending: Neurosurgery | Admitting: Neurosurgery

## 2014-08-07 ENCOUNTER — Ambulatory Visit (HOSPITAL_COMMUNITY)
Admission: RE | Admit: 2014-08-07 | Discharge: 2014-08-07 | Disposition: A | Discharge status: Home / Self Care | Payer: 59 | Source: Ambulatory Visit | Attending: Anesthesiology | Admitting: Anesthesiology

## 2014-08-07 DIAGNOSIS — J3489 Other specified disorders of nose and nasal sinuses: Secondary | ICD-10-CM | POA: Insufficient documentation

## 2014-08-07 DIAGNOSIS — R0981 Nasal congestion: Secondary | ICD-10-CM | POA: Diagnosis present

## 2014-08-07 HISTORY — DX: Other chronic pain: G89.29

## 2014-08-07 HISTORY — DX: Personal history of urinary calculi: Z87.442

## 2014-08-07 HISTORY — DX: Anesthesia of skin: R20.0

## 2014-08-07 HISTORY — DX: Muscle spasm of back: M62.830

## 2014-08-07 HISTORY — DX: Other seasonal allergic rhinitis: J30.2

## 2014-08-07 HISTORY — DX: Dorsalgia, unspecified: M54.9

## 2014-08-07 LAB — BASIC METABOLIC PANEL
ANION GAP: 13 (ref 5–15)
BUN: 12 mg/dL (ref 6–23)
CO2: 26 mEq/L (ref 19–32)
Calcium: 9.2 mg/dL (ref 8.4–10.5)
Chloride: 101 mEq/L (ref 96–112)
Creatinine, Ser: 0.99 mg/dL (ref 0.50–1.35)
GFR calc Af Amer: >90 mL/min (ref >90)
Glucose, Bld: 87 mg/dL (ref 70–99)
POTASSIUM: 4.4 meq/L (ref 3.7–5.3)
SODIUM: 140 meq/L (ref 137–147)

## 2014-08-07 LAB — CBC
HCT: 41.4 % (ref 39.0–52.0)
HEMOGLOBIN: 14.5 g/dL (ref 13.0–17.0)
MCH: 30 pg (ref 26.0–34.0)
MCHC: 35 g/dL (ref 30.0–36.0)
MCV: 85.5 fL (ref 78.0–100.0)
Platelets: 187 10*3/uL (ref 150–400)
RBC: 4.84 MIL/uL (ref 4.22–5.81)
RDW: 12.2 % (ref 11.5–15.5)
WBC: 6.6 10*3/uL (ref 4.0–10.5)

## 2014-08-07 LAB — SURGICAL PCR SCREEN
MRSA, PCR: NEGATIVE
STAPHYLOCOCCUS AUREUS: POSITIVE — AB

## 2014-08-07 NOTE — Progress Notes (Addendum)
Medical Md is Dr.Thomas Futrell  Pt doesn't have a cardiologist  Denies ever having an echo/stress test/heart cath  Denies EKG or CXR in past yr

## 2014-08-17 MED ORDER — DEXAMETHASONE SODIUM PHOSPHATE 10 MG/ML IJ SOLN
10.0000 mg | INTRAMUSCULAR | Status: AC
  Administered 2014-08-18: 10 mg via INTRAVENOUS
  Filled 2014-08-17: qty 1

## 2014-08-17 MED ORDER — CEFAZOLIN SODIUM-DEXTROSE 2-3 GM-% IV SOLR
2.0000 g | INTRAVENOUS | Status: AC
  Administered 2014-08-18: 2 g via INTRAVENOUS
  Filled 2014-08-17: qty 50

## 2014-08-18 ENCOUNTER — Encounter (HOSPITAL_COMMUNITY)
Admission: RE | Disposition: A | Discharge status: Home / Self Care | Payer: Self-pay | Source: Ambulatory Visit | Attending: Neurosurgery

## 2014-08-18 ENCOUNTER — Inpatient Hospital Stay (HOSPITAL_COMMUNITY)
Admission: RE | Admit: 2014-08-18 | Discharge: 2014-08-25 | DRG: 030 | Disposition: A | Discharge status: Home / Self Care | Payer: 59 | Source: Ambulatory Visit | Attending: Neurosurgery | Admitting: Neurosurgery

## 2014-08-18 ENCOUNTER — Inpatient Hospital Stay (HOSPITAL_COMMUNITY): Payer: 59 | Admitting: Certified Registered Nurse Anesthetist

## 2014-08-18 ENCOUNTER — Encounter (HOSPITAL_COMMUNITY): Payer: Self-pay | Admitting: *Deleted

## 2014-08-18 ENCOUNTER — Inpatient Hospital Stay (HOSPITAL_COMMUNITY): Payer: 59

## 2014-08-18 DIAGNOSIS — M62838 Other muscle spasm: Secondary | ICD-10-CM | POA: Diagnosis present

## 2014-08-18 DIAGNOSIS — Y838 Other surgical procedures as the cause of abnormal reaction of the patient, or of later complication, without mention of misadventure at the time of the procedure: Secondary | ICD-10-CM | POA: Diagnosis present

## 2014-08-18 DIAGNOSIS — G9619 Other disorders of meninges, not elsewhere classified: Secondary | ICD-10-CM | POA: Diagnosis present

## 2014-08-18 DIAGNOSIS — J302 Other seasonal allergic rhinitis: Secondary | ICD-10-CM | POA: Diagnosis present

## 2014-08-18 DIAGNOSIS — M419 Scoliosis, unspecified: Secondary | ICD-10-CM | POA: Diagnosis present

## 2014-08-18 DIAGNOSIS — Z419 Encounter for procedure for purposes other than remedying health state, unspecified: Secondary | ICD-10-CM

## 2014-08-18 DIAGNOSIS — G9782 Other postprocedural complications and disorders of nervous system: Secondary | ICD-10-CM | POA: Diagnosis present

## 2014-08-18 HISTORY — PX: LUMBAR LAMINECTOMY/DECOMPRESSION MICRODISCECTOMY: SHX5026

## 2014-08-18 SURGERY — LUMBAR LAMINECTOMY/DECOMPRESSION MICRODISCECTOMY 1 LEVEL
Anesthesia: General | Site: Spine Lumbar

## 2014-08-18 MED ORDER — MENTHOL 3 MG MT LOZG: 1.0000 | LOZENGE | OROMUCOSAL | Status: DC | PRN

## 2014-08-18 MED ORDER — CYCLOBENZAPRINE HCL 10 MG PO TABS
10.0000 mg | ORAL_TABLET | Freq: Three times a day (TID) | ORAL | Status: DC | PRN
  Administered 2014-08-21 – 2014-08-24 (×5): 10 mg via ORAL
  Filled 2014-08-18 (×7): qty 1

## 2014-08-18 MED ORDER — EPHEDRINE SULFATE 50 MG/ML IJ SOLN
INTRAMUSCULAR | Status: DC | PRN
  Administered 2014-08-18: 10 mg via INTRAVENOUS

## 2014-08-18 MED ORDER — HYDROMORPHONE HCL 1 MG/ML IJ SOLN
1.0000 mg | INTRAMUSCULAR | Status: DC | PRN
  Administered 2014-08-18 – 2014-08-21 (×9): 1 mg via INTRAMUSCULAR
  Filled 2014-08-18 (×10): qty 1

## 2014-08-18 MED ORDER — LIDOCAINE HCL (CARDIAC) 20 MG/ML IV SOLN
INTRAVENOUS | Status: DC | PRN
  Administered 2014-08-18: 80 mg via INTRAVENOUS

## 2014-08-18 MED ORDER — HEMOSTATIC AGENTS (NO CHARGE) OPTIME
TOPICAL | Status: DC | PRN
  Administered 2014-08-18 (×2): 1 via TOPICAL

## 2014-08-18 MED ORDER — THROMBIN 5000 UNITS EX SOLR
CUTANEOUS | Status: DC | PRN
  Administered 2014-08-18 (×2): 5000 [IU] via TOPICAL

## 2014-08-18 MED ORDER — KCL IN DEXTROSE-NACL 20-5-0.45 MEQ/L-%-% IV SOLN
80.0000 mL/h | INTRAVENOUS | Status: DC
  Administered 2014-08-18 – 2014-08-20 (×4): 80 mL/h via INTRAVENOUS
  Filled 2014-08-18 (×8): qty 1000

## 2014-08-18 MED ORDER — ROCURONIUM BROMIDE 50 MG/5ML IV SOLN
INTRAVENOUS | Status: AC
  Filled 2014-08-18: qty 1

## 2014-08-18 MED ORDER — PHENOL 1.4 % MT LIQD: 1.0000 | OROMUCOSAL | Status: DC | PRN

## 2014-08-18 MED ORDER — ONDANSETRON HCL 4 MG/2ML IJ SOLN
INTRAMUSCULAR | Status: AC
  Filled 2014-08-18: qty 2

## 2014-08-18 MED ORDER — OXYCODONE HCL 5 MG PO TABS: 10.0000 mg | ORAL_TABLET | ORAL | Status: DC | PRN

## 2014-08-18 MED ORDER — SCOPOLAMINE 1 MG/3DAYS TD PT72
MEDICATED_PATCH | TRANSDERMAL | Status: DC | PRN
  Administered 2014-08-18: 1 via TRANSDERMAL

## 2014-08-18 MED ORDER — GLYCOPYRROLATE 0.2 MG/ML IJ SOLN
INTRAMUSCULAR | Status: DC | PRN
  Administered 2014-08-18: 0.6 mg via INTRAVENOUS

## 2014-08-18 MED ORDER — HYDROCODONE-ACETAMINOPHEN 5-325 MG PO TABS
1.0000 | ORAL_TABLET | ORAL | Status: DC | PRN
  Filled 2014-08-18: qty 2

## 2014-08-18 MED ORDER — MIDAZOLAM HCL 2 MG/2ML IJ SOLN
INTRAMUSCULAR | Status: AC
  Filled 2014-08-18: qty 2

## 2014-08-18 MED ORDER — NEOSTIGMINE METHYLSULFATE 10 MG/10ML IV SOLN
INTRAVENOUS | Status: DC | PRN
  Administered 2014-08-18: 4 mg via INTRAVENOUS

## 2014-08-18 MED ORDER — PROPOFOL 10 MG/ML IV BOLUS
INTRAVENOUS | Status: DC | PRN
  Administered 2014-08-18: 160 mg via INTRAVENOUS
  Administered 2014-08-18: 40 mg via INTRAVENOUS

## 2014-08-18 MED ORDER — FENTANYL CITRATE 0.05 MG/ML IJ SOLN
INTRAMUSCULAR | Status: AC
  Filled 2014-08-18: qty 5

## 2014-08-18 MED ORDER — FENTANYL CITRATE 0.05 MG/ML IJ SOLN
INTRAMUSCULAR | Status: DC | PRN
  Administered 2014-08-18: 150 ug via INTRAVENOUS
  Administered 2014-08-18: 100 ug via INTRAVENOUS

## 2014-08-18 MED ORDER — SODIUM CHLORIDE 0.9 % IJ SOLN: 3.0000 mL | INTRAMUSCULAR | Status: DC | PRN

## 2014-08-18 MED ORDER — DOCUSATE SODIUM 100 MG PO CAPS
100.0000 mg | ORAL_CAPSULE | Freq: Two times a day (BID) | ORAL | Status: DC
  Administered 2014-08-18 – 2014-08-25 (×12): 100 mg via ORAL
  Filled 2014-08-18 (×15): qty 1

## 2014-08-18 MED ORDER — PROPOFOL 10 MG/ML IV BOLUS
INTRAVENOUS | Status: AC
  Filled 2014-08-18: qty 20

## 2014-08-18 MED ORDER — ONDANSETRON HCL 4 MG/2ML IJ SOLN
INTRAMUSCULAR | Status: DC | PRN
  Administered 2014-08-18: 4 mg via INTRAVENOUS

## 2014-08-18 MED ORDER — SODIUM CHLORIDE 0.9 % IV SOLN: 250.0000 mL | INTRAVENOUS | Status: DC

## 2014-08-18 MED ORDER — LIDOCAINE HCL (CARDIAC) 20 MG/ML IV SOLN
INTRAVENOUS | Status: AC
  Filled 2014-08-18: qty 5

## 2014-08-18 MED ORDER — BACITRACIN 50000 UNITS IM SOLR
INTRAMUSCULAR | Status: DC | PRN
  Administered 2014-08-18: 500 mL

## 2014-08-18 MED ORDER — ARTIFICIAL TEARS OP OINT
TOPICAL_OINTMENT | OPHTHALMIC | Status: DC | PRN
  Administered 2014-08-18: 1 via OPHTHALMIC

## 2014-08-18 MED ORDER — 0.9 % SODIUM CHLORIDE (POUR BTL) OPTIME
TOPICAL | Status: DC | PRN
  Administered 2014-08-18: 1000 mL

## 2014-08-18 MED ORDER — SODIUM CHLORIDE 0.9 % IJ SOLN
3.0000 mL | Freq: Two times a day (BID) | INTRAMUSCULAR | Status: DC
  Administered 2014-08-18 – 2014-08-21 (×6): 3 mL via INTRAVENOUS

## 2014-08-18 MED ORDER — ACETAMINOPHEN 325 MG PO TABS
650.0000 mg | ORAL_TABLET | ORAL | Status: DC | PRN
  Administered 2014-08-22: 650 mg via ORAL
  Filled 2014-08-18: qty 2

## 2014-08-18 MED ORDER — BUPIVACAINE HCL (PF) 0.5 % IJ SOLN
INTRAMUSCULAR | Status: DC | PRN
  Administered 2014-08-18: 20 mL

## 2014-08-18 MED ORDER — CEFAZOLIN SODIUM-DEXTROSE 2-3 GM-% IV SOLR
2.0000 g | Freq: Four times a day (QID) | INTRAVENOUS | Status: DC
  Administered 2014-08-18 – 2014-08-21 (×13): 2 g via INTRAVENOUS
  Filled 2014-08-18 (×15): qty 50

## 2014-08-18 MED ORDER — ACETAMINOPHEN 650 MG RE SUPP: 650.0000 mg | RECTAL | Status: DC | PRN

## 2014-08-18 MED ORDER — ONDANSETRON HCL 4 MG/2ML IJ SOLN
4.0000 mg | INTRAMUSCULAR | Status: DC | PRN
  Administered 2014-08-18 – 2014-08-21 (×3): 4 mg via INTRAVENOUS
  Filled 2014-08-18 (×2): qty 2

## 2014-08-18 MED ORDER — PANTOPRAZOLE SODIUM 40 MG IV SOLR
40.0000 mg | Freq: Every day | INTRAVENOUS | Status: DC
  Administered 2014-08-18 – 2014-08-19 (×2): 40 mg via INTRAVENOUS
  Filled 2014-08-18 (×4): qty 40

## 2014-08-18 MED ORDER — FENTANYL CITRATE 0.05 MG/ML IJ SOLN: 25.0000 ug | INTRAMUSCULAR | Status: DC | PRN

## 2014-08-18 MED ORDER — LACTATED RINGERS IV SOLN
INTRAVENOUS | Status: DC | PRN
  Administered 2014-08-18 (×2): via INTRAVENOUS

## 2014-08-18 MED ORDER — ROCURONIUM BROMIDE 100 MG/10ML IV SOLN
INTRAVENOUS | Status: DC | PRN
  Administered 2014-08-18: 5 mg via INTRAVENOUS
  Administered 2014-08-18: 40 mg via INTRAVENOUS

## 2014-08-18 MED ORDER — MIDAZOLAM HCL 5 MG/5ML IJ SOLN
INTRAMUSCULAR | Status: DC | PRN
  Administered 2014-08-18: 2 mg via INTRAVENOUS

## 2014-08-18 MED ORDER — SCOPOLAMINE 1 MG/3DAYS TD PT72
MEDICATED_PATCH | TRANSDERMAL | Status: AC
  Filled 2014-08-18: qty 1

## 2014-08-18 SURGICAL SUPPLY — 60 items
BAG DECANTER FOR FLEXI CONT (MISCELLANEOUS) ×3 IMPLANT
BENZOIN TINCTURE PRP APPL 2/3 (GAUZE/BANDAGES/DRESSINGS) ×3 IMPLANT
BLADE CLIPPER SURG (BLADE) ×3 IMPLANT
BRUSH SCRUB EZ PLAIN DRY (MISCELLANEOUS) ×3 IMPLANT
BUR CUTTER 7.0 ROUND (BURR) ×3 IMPLANT
BUR MATCHSTICK NEURO 3.0 LAGG (BURR) ×3 IMPLANT
CANISTER SUCT 3000ML (MISCELLANEOUS) ×3 IMPLANT
CLOSURE WOUND 1/2 X4 (GAUZE/BANDAGES/DRESSINGS) ×1
CONT SPEC 4OZ CLIKSEAL STRL BL (MISCELLANEOUS) ×3 IMPLANT
DRAIN SUBARACHNOID (WOUND CARE) ×3 IMPLANT
DRAPE C-ARM 42X72 X-RAY (DRAPES) ×3 IMPLANT
DRAPE LAPAROTOMY 100X72X124 (DRAPES) ×3 IMPLANT
DRAPE MICROSCOPE LEICA (MISCELLANEOUS) IMPLANT
DRAPE SURG 17X23 STRL (DRAPES) ×6 IMPLANT
DRSG OPSITE POSTOP 4X6 (GAUZE/BANDAGES/DRESSINGS) ×3 IMPLANT
DRSG TELFA 3X8 NADH (GAUZE/BANDAGES/DRESSINGS) IMPLANT
DURAPREP 26ML APPLICATOR (WOUND CARE) ×3 IMPLANT
DURASEAL APPLICATOR TIP (TIP) ×3 IMPLANT
DURASEAL SPINE SEALANT 3ML (MISCELLANEOUS) ×3 IMPLANT
ELECT REM PT RETURN 9FT ADLT (ELECTROSURGICAL) ×3
ELECTRODE REM PT RTRN 9FT ADLT (ELECTROSURGICAL) ×1 IMPLANT
GAUZE SPONGE 4X4 12PLY STRL (GAUZE/BANDAGES/DRESSINGS) IMPLANT
GAUZE SPONGE 4X4 16PLY XRAY LF (GAUZE/BANDAGES/DRESSINGS) IMPLANT
GLOVE BIOGEL PI IND STRL 7.0 (GLOVE) ×2 IMPLANT
GLOVE BIOGEL PI IND STRL 7.5 (GLOVE) ×1 IMPLANT
GLOVE BIOGEL PI INDICATOR 7.0 (GLOVE) ×4
GLOVE BIOGEL PI INDICATOR 7.5 (GLOVE) ×2
GLOVE ECLIPSE 7.0 STRL STRAW (GLOVE) ×3 IMPLANT
GLOVE ECLIPSE 8.0 STRL XLNG CF (GLOVE) ×6 IMPLANT
GLOVE SS BIOGEL STRL SZ 6.5 (GLOVE) ×2 IMPLANT
GLOVE SUPERSENSE BIOGEL SZ 6.5 (GLOVE) ×4
GLOVE SURG SS PI 7.5 STRL IVOR (GLOVE) ×3 IMPLANT
GOWN STRL REUS W/ TWL LRG LVL3 (GOWN DISPOSABLE) ×2 IMPLANT
GOWN STRL REUS W/ TWL XL LVL3 (GOWN DISPOSABLE) ×2 IMPLANT
GOWN STRL REUS W/TWL 2XL LVL3 (GOWN DISPOSABLE) IMPLANT
GOWN STRL REUS W/TWL LRG LVL3 (GOWN DISPOSABLE) ×4
GOWN STRL REUS W/TWL XL LVL3 (GOWN DISPOSABLE) ×4
KIT BASIN OR (CUSTOM PROCEDURE TRAY) ×3 IMPLANT
KIT DRAIN CSF ACCUDRAIN (MISCELLANEOUS) ×3 IMPLANT
KIT ROOM TURNOVER OR (KITS) ×3 IMPLANT
LIQUID BAND (GAUZE/BANDAGES/DRESSINGS) ×3 IMPLANT
NEEDLE HYPO 22GX1.5 SAFETY (NEEDLE) ×3 IMPLANT
NEEDLE SPNL 22GX3.5 QUINCKE BK (NEEDLE) ×6 IMPLANT
NS IRRIG 1000ML POUR BTL (IV SOLUTION) ×3 IMPLANT
PACK LAMINECTOMY NEURO (CUSTOM PROCEDURE TRAY) ×3 IMPLANT
PAD ARMBOARD 7.5X6 YLW CONV (MISCELLANEOUS) ×15 IMPLANT
PATTIES SURGICAL .75X.75 (GAUZE/BANDAGES/DRESSINGS) IMPLANT
RUBBERBAND STERILE (MISCELLANEOUS) IMPLANT
SPONGE SURGIFOAM ABS GEL SZ50 (HEMOSTASIS) ×3 IMPLANT
STRIP CLOSURE SKIN 1/2X4 (GAUZE/BANDAGES/DRESSINGS) ×2 IMPLANT
SUT PROLENE 0 CT 1 30 (SUTURE) ×3 IMPLANT
SUT VIC AB 0 CT1 18XCR BRD8 (SUTURE) ×1 IMPLANT
SUT VIC AB 0 CT1 8-18 (SUTURE) ×2
SUT VIC AB 2-0 OS6 18 (SUTURE) ×9 IMPLANT
SUT VIC AB 3-0 CP2 18 (SUTURE) ×3 IMPLANT
SYR 20ML ECCENTRIC (SYRINGE) ×3 IMPLANT
SYRINGE 10CC LL (SYRINGE) ×3 IMPLANT
TOWEL OR 17X24 6PK STRL BLUE (TOWEL DISPOSABLE) ×3 IMPLANT
TOWEL OR 17X26 10 PK STRL BLUE (TOWEL DISPOSABLE) ×3 IMPLANT
WATER STERILE IRR 1000ML POUR (IV SOLUTION) ×3 IMPLANT

## 2014-08-18 NOTE — Progress Notes (Signed)
UR completed.  Kermit Arnette, RN BSN [facility] CCM Trauma/Neuro ICU Case Manager [phone]  

## 2014-08-18 NOTE — Anesthesia Postprocedure Evaluation (Signed)
  Anesthesia Post-op Note  Patient: Christian Norman  Procedure(s) Performed: Procedure(s): LUMBAR RE-EXPLORATION WITH PLACEMENT OF LUMBAR DRAIN (N/A)  Patient Location: PACU  Anesthesia Type:General  Level of Consciousness: awake  Airway and Oxygen Therapy: Patient Spontanous Breathing  Post-op Pain: mild  Post-op Assessment: Post-op Vital signs reviewed  Post-op Vital Signs: Reviewed  Last Vitals:  Filed Vitals:   08/18/14 1100  BP: 106/66  Pulse: 59  Temp:   Resp: 14    Complications: No apparent anesthesia complications

## 2014-08-18 NOTE — Transfer of Care (Signed)
Immediate Anesthesia Transfer of Care Note  Patient: Christian Norman  Procedure(s) Performed: Procedure(s): LUMBAR RE-EXPLORATION WITH PLACEMENT OF LUMBAR DRAIN (N/A)  Patient Location: PACU  Anesthesia Type:General  Level of Consciousness: awake, alert , oriented and patient cooperative  Airway & Oxygen Therapy: Patient Spontanous Breathing and Patient connected to nasal cannula oxygen  Post-op Assessment: Report given to PACU RN, Post -op Vital signs reviewed and stable and Patient moving all extremities  Post vital signs: Reviewed and stable  Complications: No apparent anesthesia complications

## 2014-08-18 NOTE — Plan of Care (Signed)
Problem: Consults Goal: Diagnosis - Spinal Surgery Outcome: Completed/Met Date Met:  08/18/14 Lumbar wound exploration L5-S1 (lumbar drain applied).

## 2014-08-18 NOTE — Progress Notes (Signed)
RT came to do IS with pt.  Pt states he cant do IS until he uses bathroom.  RN aware that he is having difficulty urinating. RT will cont to follow.  RN aware of pt refusing IS at this time

## 2014-08-18 NOTE — H&P (Signed)
Christian SpryBradford Norman is an 43 y.o. male.   Chief Complaint: Pseudomeningocele HPI: The patient is a 43 year old gentleman who had a lumbar discectomy many months ago. At that time he had a dural opening which was sealed primarily. He did well for about 2 months and then developed a fluid collection underneath the wound. There was reexplored and the repair of the dura had not healed. At that time it was repaired primarily and the patient was kept flat in bed for a few days. He once again did well for a few months but then once again developed a fluid collection underneath a well-healed lumbar wound. After discussing the options it was elected at this time to bring him back reexplore the area repair the wound primarily and then place a lumbar drain for fluid diverging. The patient understood the plan agreed with it and now comes for the above-mentioned procedure. I've had a long discussion with him regarding the risks and benefits of surgical intervention. The risks discussed include but are not limited to bleeding infection weakness some as paralysis spinal fluid leak coma and death. We have discussed alternative methods of therapy offered risks and benefits of nonintervention. He's had the opportunity to rest numerous questions and appears to understand. With separation hand he has requested we proceed with surgery.  Past Medical History  Diagnosis Date  . Arthritis   . PONV (postoperative nausea and vomiting)   . Muscle spasm of back     takes Flexeril daily as needed  . Seasonal allergies   . Numbness     in right leg  . Chronic back pain     pseudomeningocele;scoliosis  . History of kidney stones     Past Surgical History  Procedure Laterality Date  . Back surgery  08/08/13    lumbar laminectomy  . Tonsillectomy    . Foot surgery Bilateral     surgery x 3 for club feet  . Anterior cruciate ligament repair Left 2013  . Lumbar laminectomy/decompression microdiscectomy N/A 01/20/2014   Procedure: Lumbar five-Sacral one laminectomy for pseudomeningocele;  Surgeon: Reinaldo Meekerandy O Chadwin Fury, MD;  Location: [facility];  Service: Neurosurgery;  Laterality: N/A;  . Tumor removed from left hand  2012    Family History  Problem Relation Age of Onset  . Leukemia Mother    Social History:  reports that he has never smoked. He has never used smokeless tobacco. He reports that he does not drink alcohol or use illicit drugs.  Allergies:  Allergies  Allergen Reactions  . Vicodin [Hydrocodone-Acetaminophen] Nausea And Vomiting    Medications Prior to Admission  Medication Sig Dispense Refill  . ibuprofen (ADVIL) 200 MG tablet Take 400 mg by mouth every 6 (six) hours as needed (pain).    . cyclobenzaprine (FLEXERIL) 10 MG tablet Take 1 tablet (10 mg total) by mouth 3 (three) times daily as needed for muscle spasms. 60 tablet 1  . oxyCODONE-acetaminophen (PERCOCET/ROXICET) 5-325 MG per tablet Take 1-2 tablets by mouth every 4 (four) hours as needed for moderate pain. 80 tablet 0    No results found for this or any previous visit (from the past 48 hour(s)). No results found.  A comprehensive review of systems was negative.  Blood pressure 128/82, pulse 52, temperature 97.6 F (36.4 C), temperature source Oral, resp. rate 20, height 5\' 10"  (1.778 m), weight 83.462 kg (184 lb), SpO2 100 %.  Incision/Wound: well-healed lumbar incision with fluid collection below it Assessment/Plan Impression is that of a pseudomeningocele.  The plan is for primary repair followed by placement of lumbar drain for fluid diverging. Reinaldo MeekerKRITZER,Alora Gorey O, MD 08/18/2014, 7:24 AM

## 2014-08-18 NOTE — Op Note (Signed)
Preop diagnosis: Pseudomeningocele L5-S1 Postop diagnosis: Spinal fluid collection L5-S1 Procedure: Exploration of lumbar wound L5-S1 Placement of lumbar drain Surgeon: Kashay Cavenaugh Asst.: Nundkumar  After being placed the prone position the patient's back was prepped and draped in the usual sterile fashion. Previous lumbar incision was opened up and we immediately encountered a large collection of clear fluid consistent with spinal fluid. We evacuated this and then dissected down to the spinous processes. We did a subperiosteal dissection along the edges of the previous laminotomy placed over self-retaining retractor. There is excellent scar tissue over the previous sutures that had been placed there is no evidence spinal fluid leakage or visible arachnoid. We irrigated copiously and then did a number of Valsalva maneuvers and none of them caused any spinal fluid leakage. We chose to place a lumbar drain did this without difficulty under fluoroscopic guidance. We entered the thecal sac between the spinous processes of L2 and L3 were able to easily threaded the drain up to the T12 level. Clear fluid drained easily. We explored the lumbar incision once more so. Notices spinal fluid leakage. We irrigated copiously and then placed DuraSeal and over the laminotomy. We closed the wound in multiple layers of Vicryl on the muscle fascia subcutaneous and subcuticular tissues. Dermabond Steri-Strips were placed on the skin. Sterile dressings were then applied and the patient was taken to recovery room in stable condition.

## 2014-08-18 NOTE — Anesthesia Preprocedure Evaluation (Addendum)
Anesthesia Evaluation  Patient identified by MRN, date of birth, ID band Patient awake    Reviewed: Allergy & Precautions, H&P , NPO status , Patient's Chart, lab work & pertinent test results  History of Anesthesia Complications (+) PONV  Airway Mallampati: II       Dental   Pulmonary neg pulmonary ROS,  breath sounds clear to auscultation        Cardiovascular negative cardio ROS  Rhythm:Regular Rate:Normal     Neuro/Psych    GI/Hepatic negative GI ROS, Neg liver ROS,   Endo/Other  negative endocrine ROS  Renal/GU negative Renal ROS     Musculoskeletal   Abdominal   Peds  Hematology   Anesthesia Other Findings   Reproductive/Obstetrics                            Anesthesia Physical Anesthesia Plan  ASA: II  Anesthesia Plan:    Post-op Pain Management:    Induction: Intravenous  Airway Management Planned: Oral ETT  Additional Equipment:   Intra-op Plan:   Post-operative Plan: Extubation in OR  Informed Consent: I have reviewed the patients History and Physical, chart, labs and discussed the procedure including the risks, benefits and alternatives for the proposed anesthesia with the patient or authorized representative who has indicated his/her understanding and acceptance.     Plan Discussed with: CRNA, Anesthesiologist and Surgeon  Anesthesia Plan Comments:        Anesthesia Quick Evaluation

## 2014-08-18 NOTE — Anesthesia Procedure Notes (Signed)
Procedure Name: Intubation Date/Time: 08/18/2014 7:37 AM Performed by: Jerilee HohMUMM, Damaris Geers N Pre-anesthesia Checklist: Emergency Drugs available, Patient identified, Suction available and Patient being monitored Patient Re-evaluated:Patient Re-evaluated prior to inductionOxygen Delivery Method: Circle system utilized Preoxygenation: Pre-oxygenation with 100% oxygen Intubation Type: IV induction Ventilation: Mask ventilation without difficulty Laryngoscope Size: Mac and 4 Grade View: Grade I Tube type: Oral Tube size: 7.5 mm Number of attempts: 1 Airway Equipment and Method: Stylet Placement Confirmation: ETT inserted through vocal cords under direct vision,  positive ETCO2 and breath sounds checked- equal and bilateral Secured at: 22 cm Tube secured with: Tape Dental Injury: Teeth and Oropharynx as per pre-operative assessment

## 2014-08-19 ENCOUNTER — Encounter (HOSPITAL_COMMUNITY): Payer: Self-pay | Admitting: Neurosurgery

## 2014-08-19 NOTE — Progress Notes (Signed)
Patient ID: Christian Norman, male   DOB: 1970/11/13, 43 y.o.   MRN: [id number] Afeb, vss No new neuro issues Drain working well. No significant pain. Will continue drainage for a few more days.

## 2014-08-20 MED ORDER — PANTOPRAZOLE SODIUM 40 MG PO TBEC
40.0000 mg | DELAYED_RELEASE_TABLET | Freq: Every day | ORAL | Status: DC
  Administered 2014-08-20 – 2014-08-24 (×5): 40 mg via ORAL
  Filled 2014-08-20 (×5): qty 1

## 2014-08-20 NOTE — Progress Notes (Signed)
Patient ID: Dondra SpryBradford Norman, male   DOB: Jan 02, 1971, 43 y.o.   MRN: [id number] Afeb, vss No new neuro issues Drain working well. Will continue drainage until Saturday morning and then d/c lumbar drain.

## 2014-08-21 MED ORDER — OXYCODONE HCL 5 MG PO TABS
5.0000 mg | ORAL_TABLET | ORAL | Status: DC | PRN
Start: 2014-08-21 — End: 2014-08-21

## 2014-08-21 MED ORDER — OXYCODONE HCL 5 MG PO TABS
5.0000 mg | ORAL_TABLET | ORAL | Status: DC | PRN
  Administered 2014-08-21 – 2014-08-24 (×9): 5 mg via ORAL
  Filled 2014-08-21 (×2): qty 1
  Filled 2014-08-21: qty 2
  Filled 2014-08-21 (×8): qty 1

## 2014-08-21 NOTE — Progress Notes (Signed)
Patient ID: Christian Norman, male   DOB: [birth year], 43 y.o.   MRN: [id number] Afeb, vss No new neuro issues He has a bit more headache today. We have drained for more than 72 hours and there was not an obvious leak at surgery. I will remove his drain today and start to increase activity.

## 2014-08-21 NOTE — Progress Notes (Signed)
Patient recd on stretcher to [phone]. Patient oriented to unit, surroundings and safety plan. All questions answered, family at bedside.

## 2014-08-22 NOTE — Progress Notes (Signed)
Subjective: Patient resting in bed, complaining of back pain, neck pain, and headache. He says that he had been feeling well yesterday, but is felt worse today. Has been up to the bathroom, but not ambulate and otherwise.  Objective: Vital signs in last 24 hours: Filed Vitals:   08/21/14 2129 08/22/14 0122 08/22/14 0500 08/22/14 1011  BP: 114/50 105/67 106/62 102/72  Pulse: 67 59 76 64  Temp: 98.2 F (36.8 C) 96.8 F (36 C) 97.7 F (36.5 C) 98.3 F (36.8 C)  TempSrc: Oral Oral Oral Oral  Resp: 18 18 18 20   Height:      Weight:      SpO2: 96% 97% 97% 96%    Intake/Output from previous day: 12/25 0701 - 12/26 0700 In: 533 [I.V.:483; IV Piggyback:50] Out: 1224 [Urine:1180; Drains:44] Intake/Output this shift:    Physical Exam:  Awake alert, oriented. Following commands. Moving all 4 extremities. Wound healing nicely.  Assessment/Plan: Continue supportive care. Encourage patient to have head of bed flat as much as feasible.   Hewitt ShortsNUDELMAN,ROBERT W, MD 08/22/2014, 10:32 AM

## 2014-08-23 NOTE — Progress Notes (Signed)
Pt reported having significant amount of fluid drainage from his back last night. Dressing was then changed at approx 11p (12 hrs ago). He has been flat since last night. He reports continued HA which started prior to LD removal on Friday.  EXAM:  BP 112/65 mmHg  Pulse 68  Temp(Src) 98.7 F (37.1 C) (Oral)  Resp 16  Ht 5\' 10"  (1.778 m)  Wt 83.462 kg (184 lb)  BMI 26.40 kg/m2  SpO2 98%  Awake, alert, oriented  Speech fluent, appropriate  CN grossly intact  5/5 BUE/BLE  Wound appears dry, no active drainage seen. Dressing is dry, no evidence of fluid on dressing.  IMPRESSION:  43 y.o. male s/p wound exploration/LD placement of delayed CSF leak. Had fluid leakage last night, unclear whether this was CSF or possible subcutaenous seroma. Dressing remains dry and no active leak is seen.  PLAN: - Will hold off on replacement of LD - Keep flat in bed for another 24 hrs and monitor site for leak

## 2014-08-23 NOTE — Progress Notes (Addendum)
Contacted Neurosurgery on call regarding patients incision site leaking clear fluid.  MD gave orders to place a pressure dressing, keep patient laying flat through the night, and to continue to assess site for more leaking.  Pt compliant with orders.  Will continue to monitor.

## 2014-08-24 NOTE — Progress Notes (Signed)
Compression dressing applied to incision per MD's order.

## 2014-08-24 NOTE — Progress Notes (Signed)
Patient ID: Christian Norman, male   DOB: [birth year], 43 y.o.   MRN: [id number] Afeb, vss No new neuro issues He reportedly had some drainage over the weekend, but I expect it was from the drain tract. Will start to increase activity today. If that goes well, hopefully discharge tomorrow.

## 2014-08-25 MED ORDER — OXYCODONE HCL 5 MG PO TABS: 5.0000 mg | ORAL_TABLET | ORAL | Status: AC | PRN

## 2014-08-25 NOTE — Progress Notes (Addendum)
Compression dressing (gauze and surgical tape) applied.  No drainage noted.  Will continue to monitor. Sondra ComeSilva, Mckenna Gamm M, RN

## 2014-08-25 NOTE — Progress Notes (Signed)
Discharge orders received.  Discharge instructions and follow-up appointments reviewed with the patient.  IV removed and education complete.  VSS upon discharge.  Transported out via wheelchair family present. Sondra ComeSilva, Nasra Counce M, RN

## 2014-08-25 NOTE — Discharge Summary (Signed)
  Physician Discharge Summary  Patient ID: Christian Norman MRN: [id number] DOB/AGE: [birth year] 45 y.Norman.  Admit date: 08/18/2014 Discharge date: 08/25/2014  Admission Diagnoses:  Discharge Diagnoses:  Active Problems:   Postprocedural pseudomeningocele   Discharged Condition: good  Hospital Course: Surgery 1 week ago for possible CSF leak. No leak found. Kept on lumbar drainage for 72 hours. Then slowly increased activity. Wound stayed clean and dry. Home pod 7, specific instructions given.  Consults: None  Significant Diagnostic Studies: none  Treatments: surgery: Exploration of lumbar wound with placement of lumbar drain.  Discharge Exam: Blood pressure 115/62, pulse 84, temperature 97.9 F (36.6 C), temperature source Oral, resp. rate 20, height 5\' 10"  (1.778 m), weight 83.462 kg (184 lb), SpO2 98 %. Incision/Wound:clean and dry; no redness, swelling or drainage.  Disposition: 01-Home or Self Care     Medication List    ASK your doctor about these medications        ADVIL 200 MG tablet  Generic drug:  ibuprofen  Take 400 mg by mouth every 6 (six) hours as needed (pain).     cyclobenzaprine 10 MG tablet  Commonly known as:  FLEXERIL  Take 1 tablet (10 mg total) by mouth 3 (three) times daily as needed for muscle spasms.     oxyCODONE-acetaminophen 5-325 MG per tablet  Commonly known as:  PERCOCET/ROXICET  Take 1-2 tablets by mouth every 4 (four) hours as needed for moderate pain.         At home rest most of the time. Get up 9 or 10 times each day and take a 15 or 20 minute walk. No riding in the car and to your first postoperative appointment. If you have neck surgery you may shower from the chest down starting on the third postoperative day. If you had back surgery he may start showering on the third postoperative day with saran wrap wrapped around your incisional area 3 times. After the shower remove the saran wrap. Take pain medicine as needed and other  medications as instructed. Call my office for an appointment.  SignedReinaldo Meeker: Christian Alpern O, MD 08/25/2014, 10:51 AM

## 2015-07-21 IMAGING — CR DG CHEST 2V
2 series · 2 of 2 positions shown · non-contrast
Comparison: None.

CLINICAL DATA: 43-year-old male with sinus congestion and drainage.
Preoperative study for lumbar drain placement. Initial encounter.

EXAM:
CHEST  2 VIEW

[w chest pa]
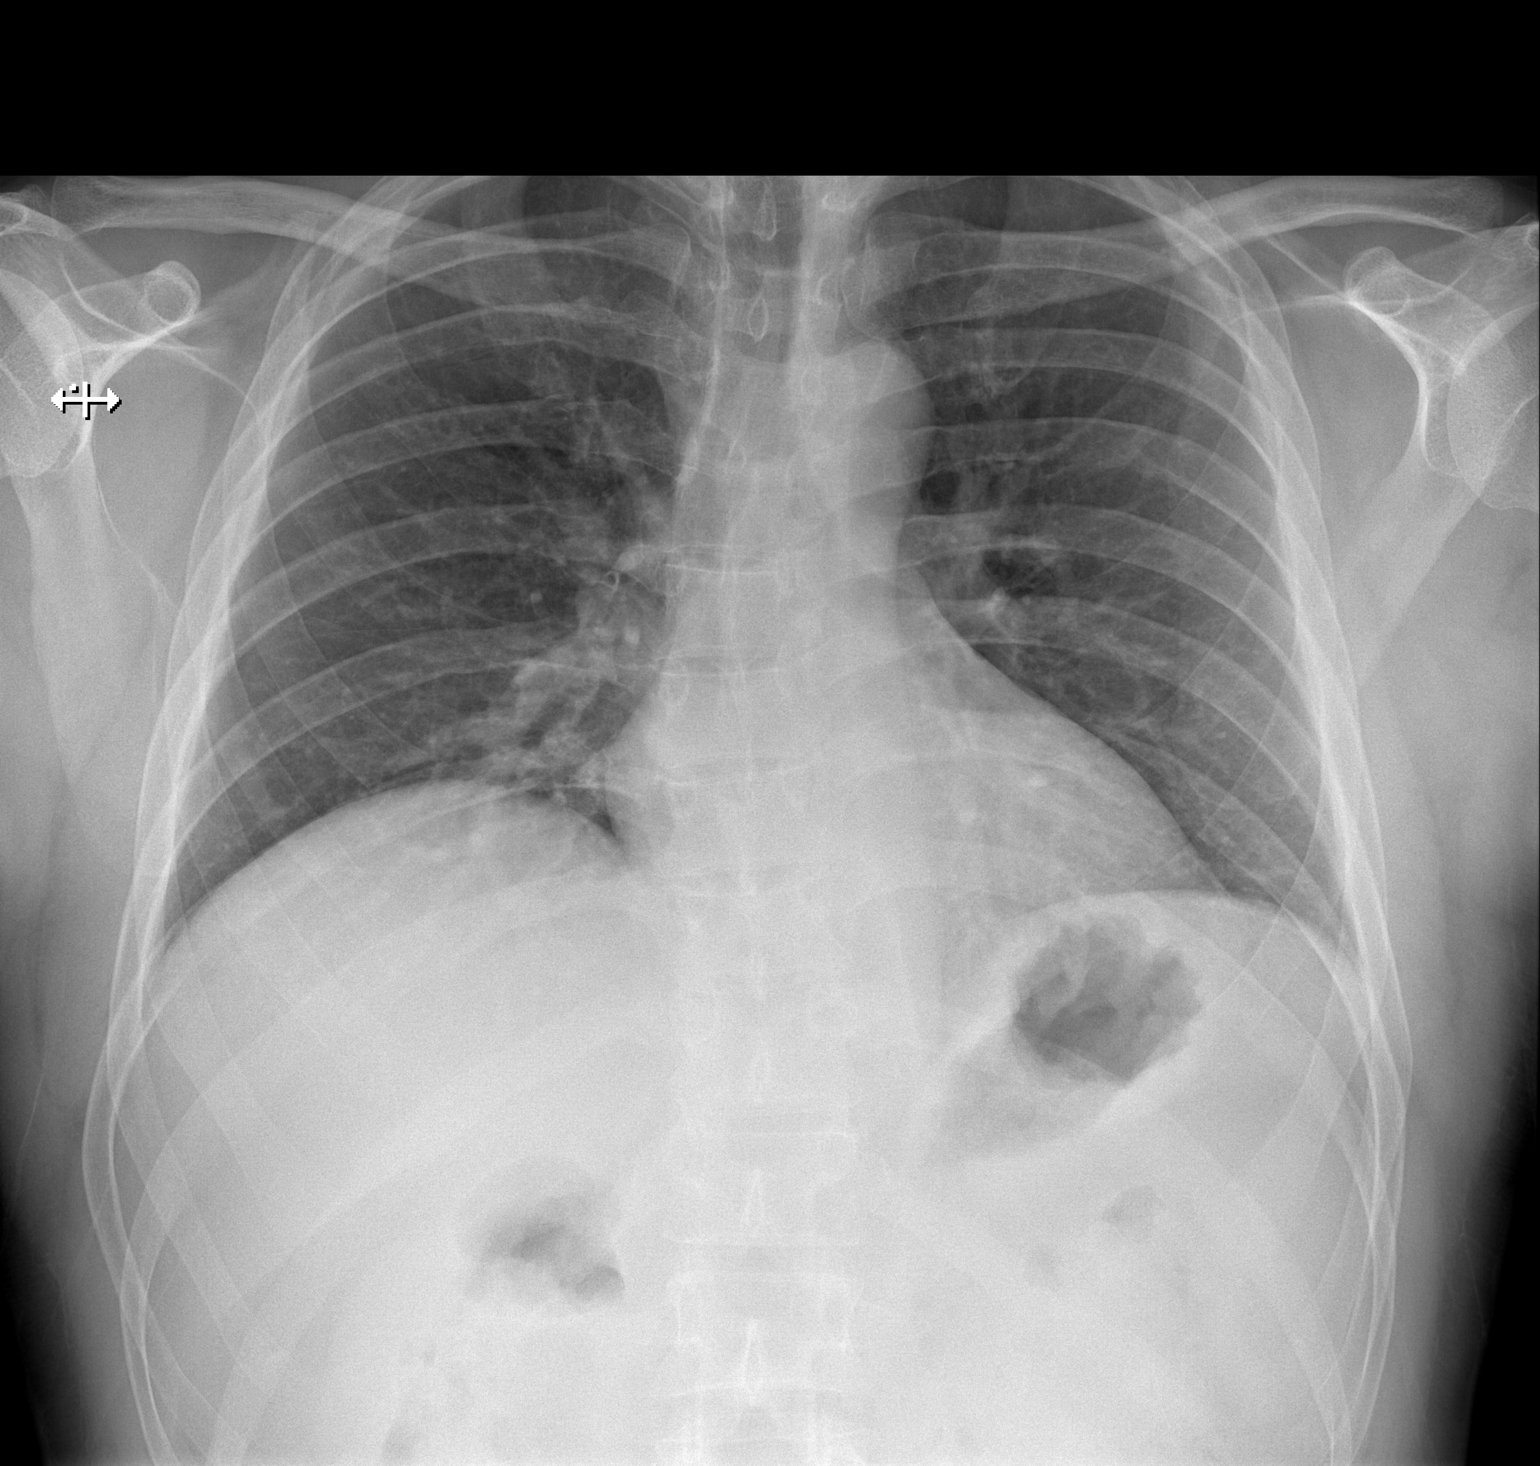

[w chest lat]
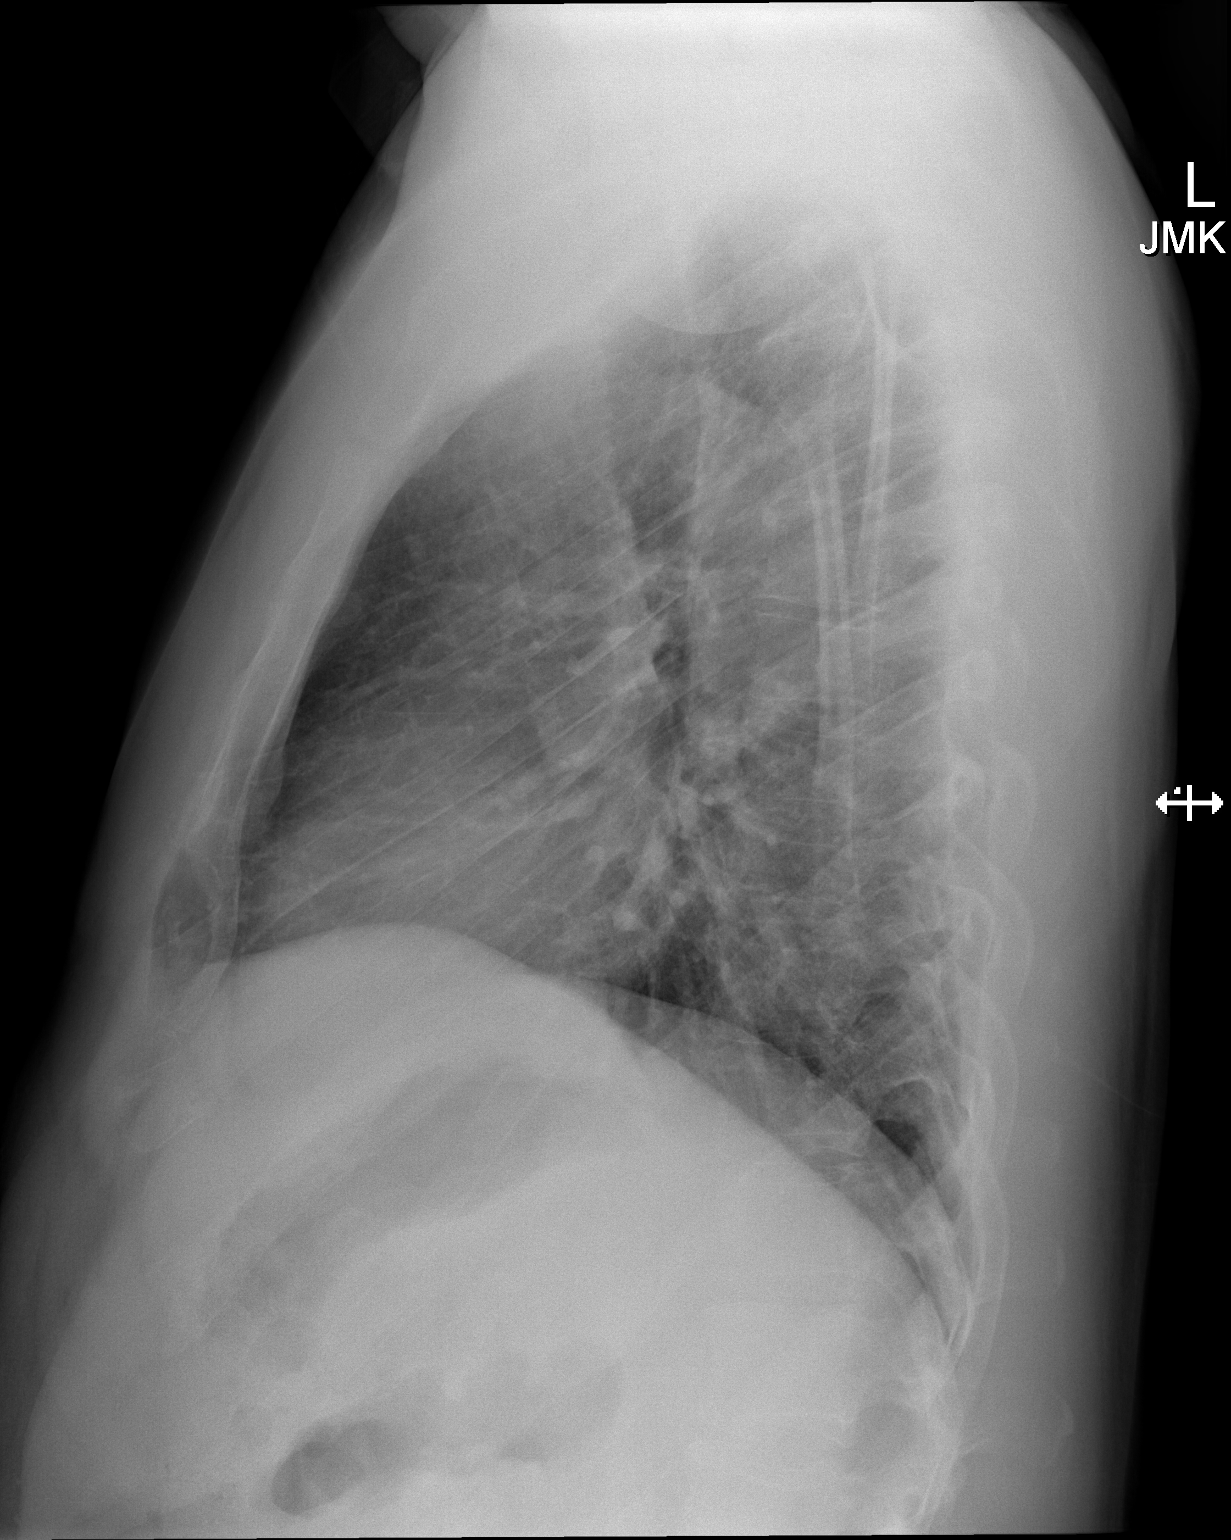

[2 of 2 positions shown; findings below may reference images not displayed]

FINDINGS: Somewhat low lung volumes. Normal cardiac size and mediastinal
contours. Visualized tracheal air column is within normal limits. No
pneumothorax, pulmonary edema, pleural effusion or confluent
pulmonary opacity. No acute osseous abnormality identified.
IMPRESSION: Low lung volumes, otherwise no acute cardiopulmonary abnormality.

## 2015-08-01 IMAGING — RF DG C-ARM 61-120 MIN
1 series · 1 of 1 positions shown · non-contrast
Comparison: MRI lumbar spine 04/25/2014

CLINICAL DATA: Placement of lumbar drain due to leaking issues at
old wound

EXAM:
DG C-ARM 61-120 MIN; THORACOLUMBAR SPINE - 2 VIEW
FLUOROSCOPY TIME:  0 min 24 seconds

[Series 1: run · 1 of 1 slices shown]
[im 1/1]
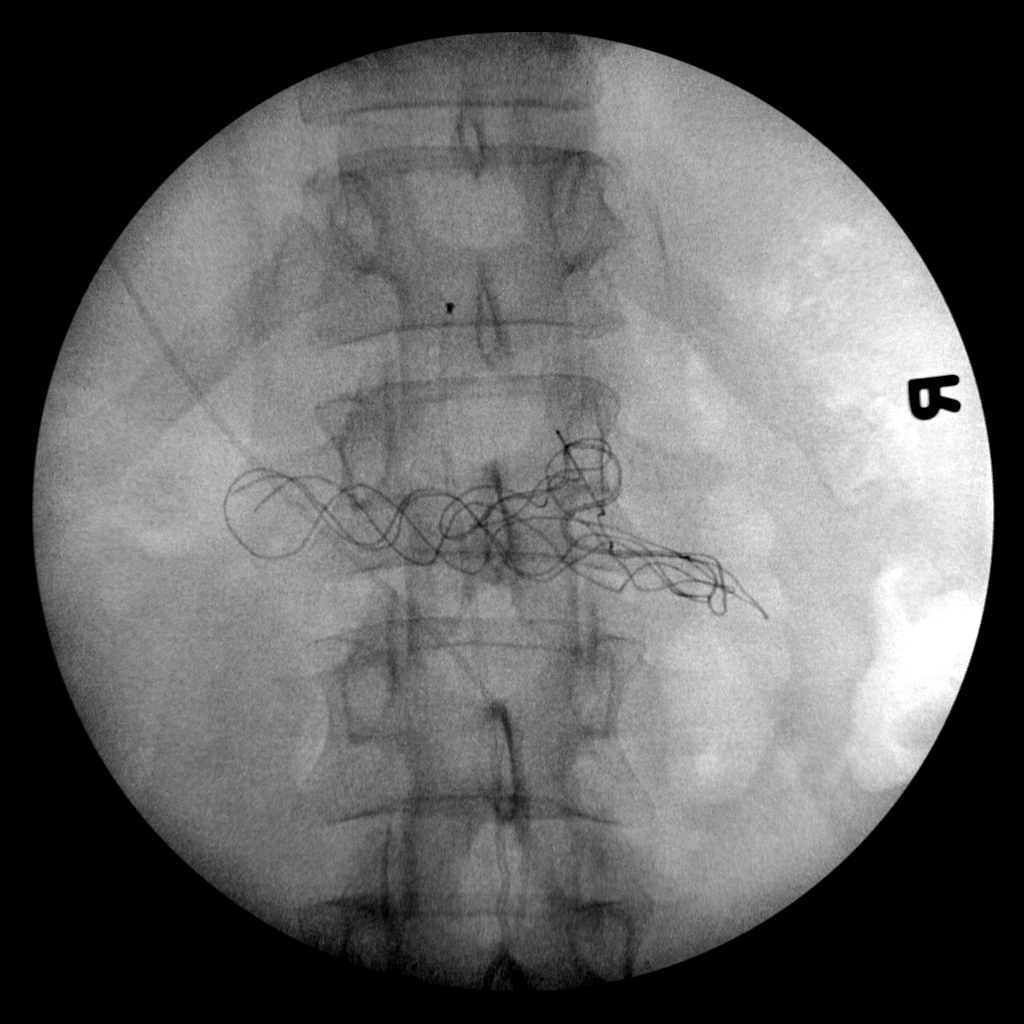

[1 of 1 positions shown; findings below may reference images not displayed]

FINDINGS: Single intraoperative digital C-arm fluoroscopic image submitted.

Image demonstrates presence of a drainage catheter projecting over
the spine, extending from superior L3 to inferior T12.

Surgical sponge projects over L1.
IMPRESSION: Tip of drainage catheter projecting over inferior T12.
